# Patient Record
Sex: Male | Born: 1995 | Race: White | Hispanic: Yes | Marital: Single
Health system: Southern US, Community
[De-identification: ages and names within clinical notes are randomized; demographics above are authoritative.]

## PROBLEM LIST (undated history)

## (undated) DIAGNOSIS — Z789 Other specified health status: Secondary | ICD-10-CM

## (undated) DIAGNOSIS — A048 Other specified bacterial intestinal infections: Secondary | ICD-10-CM

## (undated) HISTORY — DX: Other specified health status: Z78.9

## (undated) HISTORY — DX: Other specified bacterial intestinal infections: A04.8

---
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
## 2017-02-01 ENCOUNTER — Telehealth: Payer: Self-pay

## 2017-02-01 NOTE — Telephone Encounter (Signed)
Pre visit call attempted. Left message for return call. 

## 2017-02-02 ENCOUNTER — Ambulatory Visit: Payer: No Typology Code available for payment source | Admitting: Family Medicine

## 2017-02-05 ENCOUNTER — Encounter: Payer: Self-pay | Admitting: Family Medicine

## 2017-02-05 ENCOUNTER — Ambulatory Visit (INDEPENDENT_AMBULATORY_CARE_PROVIDER_SITE_OTHER): Payer: PRIVATE HEALTH INSURANCE | Admitting: Family Medicine

## 2017-02-05 VITALS — BP 112/70 | HR 55 | Temp 98.6°F | Ht 64.0 in | Wt 124.1 lb

## 2017-02-05 DIAGNOSIS — Z114 Encounter for screening for human immunodeficiency virus [HIV]: Secondary | ICD-10-CM

## 2017-02-05 DIAGNOSIS — N481 Balanitis: Secondary | ICD-10-CM | POA: Diagnosis not present

## 2017-02-05 DIAGNOSIS — Z0001 Encounter for general adult medical examination with abnormal findings: Secondary | ICD-10-CM

## 2017-02-05 DIAGNOSIS — Z0000 Encounter for general adult medical examination without abnormal findings: Secondary | ICD-10-CM

## 2017-02-05 MED ORDER — CLOTRIMAZOLE 1 % EX CREA: 1.0000 "application " | TOPICAL_CREAM | Freq: Two times a day (BID) | CUTANEOUS | 0 refills | Status: AC

## 2017-02-05 NOTE — Patient Instructions (Signed)
Apply cream twice daily for 4 weeks. If no improvement, return to clinic.  Fast for 9-12 hours prior to your lab appointment.  Let us know if you need anything.

## 2017-02-05 NOTE — Progress Notes (Signed)
Pre visit review using our clinic review tool, if applicable. No additional management support is needed unless otherwise documented below in the visit note. 

## 2017-02-05 NOTE — Progress Notes (Signed)
Chief Complaint  Patient presents with  . Establish Care    Well Male Kenneth Vega is here for a complete physical.   His last physical was >1 year ago.  Current diet: in general, a "healthy" diet   Current exercise: Plays basketball, soccer, lift weights Weight trend: stable Does pt snore? No. Daytime fatigue? No. Seat belt? Yes.    Has itchy area around head of penis. Notices it most when he works out. He is sexually active and has engaged in unprotected intercourse recently. No fevers, abd pain, testicular pain, pain with urination, bleeding, discharge, or partner complaints.   Health maintenance Tetanus- Yes 04/2016 HIV- Yes- has had unprotected intercourse since then and would like to be checked again  Past Medical History:  Diagnosis Date  . No known health problems     Past Surgical History:  Procedure Laterality Date  . CHOLECYSTECTOMY  07/31/2015   Medications  Takes no medications routinely.   Allergies No Known Allergies   Family History Family History  Problem Relation Age of Onset  . Cancer Neg Hx     Review of Systems: Constitutional:  no unexpected change in weight, no fevers or chills Eye:  no recent significant change in vision Ear/Nose/Mouth/Throat:  Ears:  no tinnitus or hearing loss Nose/Mouth/Throat:  no complaints of nasal congestion or bleeding, no sore throat and oral sores Cardiovascular:  no chest pain, no palpitations Respiratory:  no cough and no shortness of breath Gastrointestinal:  no abdominal pain, no change in bowel habits, no nausea, vomiting, diarrhea, or constipation and no black or bloody stool GU:  Male: As noted above, otherwise negative for dysuria, frequency, and incontinence and negative for prostate symptoms Musculoskeletal/Extremities:  no pain, redness, or swelling of the joints Integumentary (Skin/Breast):  no abnormal skin lesions reported Neurologic:  no headaches, no numbness, tingling Endocrine: No weight  changes, masses in the neck, heat/cold intolerance, bowel or skin changes, or cardiovascular system symptoms Hematologic/Lymphatic:  no abnormal bleeding, no HIV risk factors, no night sweats, no swollen nodes, no weight loss  Exam BP 112/70 (BP Location: Left Arm, Patient Position: Sitting, Cuff Size: Normal)   Pulse (!) 55   Temp 98.6 F (37 C) (Oral)   Ht 5\' 4"  (1.626 m)   Wt 124 lb 2 oz (56.3 kg)   SpO2 96%   BMI 21.31 kg/m  General:  well developed, well nourished, in no apparent distress Skin:  no significant moles, warts, or growths Head:  no masses, lesions, or tenderness Eyes:  pupils equal and round, sclera anicteric without injection Ears:  canals without lesions, TMs shiny without retraction, no obvious effusion, no erythema Nose:  nares patent, septum midline, mucosa normal Throat/Pharynx:  lips and gingiva without lesion; tongue and uvula midline; non-inflamed pharynx; no exudates or postnasal drainage Neck: neck supple without adenopathy, thyromegaly, or masses Lungs:  clear to auscultation, breath sounds equal bilaterally, no respiratory distress Cardio:  regular rate and rhythm without murmurs, heart sounds without clicks or rubs Abdomen:  abdomen soft, nontender; bowel sounds normal; no masses or organomegaly Genital (male): Uncircumcised penis, mild whitish discharge surrounding base of glans and distal shaft; otherwise no lesions or discharge; testes present bilaterally without masses or tenderness Rectal: Deferred Musculoskeletal:  symmetrical muscle groups noted without atrophy or deformity Extremities:  no clubbing, cyanosis, or edema, no deformities, no skin discoloration Neuro:  gait normal; deep tendon reflexes normal and symmetric Psych: well oriented with normal range of affect and appropriate judgment/insight  Assessment and Plan  Well adult exam - Plan: CBC, Comprehensive metabolic panel, Lipid panel  Encounter for screening for HIV - Plan: HIV  antibody  Balanitis - Plan: clotrimazole (LOTRIMIN) 1 % cream   Well 21 y.o. male. Counseled on diet and exercise. Other orders as above. If cream not helpful after 4 weeks, will have him return to be re-evaluated. Will trial 1% HC cream at that point and refer to derm if that is not helpful. Follow up for fasting labs, otherwise in 1 year pending the above workup. The patient voiced understanding and agreement to the plan.  Jilda Rocheicholas Paul RosedaleWendling, DO 02/05/17 4:35 PM

## 2017-02-12 ENCOUNTER — Other Ambulatory Visit (INDEPENDENT_AMBULATORY_CARE_PROVIDER_SITE_OTHER): Payer: PRIVATE HEALTH INSURANCE

## 2017-02-12 DIAGNOSIS — Z0000 Encounter for general adult medical examination without abnormal findings: Secondary | ICD-10-CM

## 2017-02-12 DIAGNOSIS — Z114 Encounter for screening for human immunodeficiency virus [HIV]: Secondary | ICD-10-CM

## 2017-02-12 LAB — COMPREHENSIVE METABOLIC PANEL
ALT: 20 U/L (ref 0–53)
AST: 20 U/L (ref 0–37)
Albumin: 4.6 g/dL (ref 3.5–5.2)
Alkaline Phosphatase: 75 U/L (ref 39–117)
BUN: 11 mg/dL (ref 6–23)
CHLORIDE: 104 meq/L (ref 96–112)
CO2: 32 mEq/L (ref 19–32)
CREATININE: 0.92 mg/dL (ref 0.40–1.50)
Calcium: 9.7 mg/dL (ref 8.4–10.5)
GFR: 111.42 mL/min (ref >60.00)
GLUCOSE: 97 mg/dL (ref 70–99)
POTASSIUM: 3.4 meq/L — AB (ref 3.5–5.1)
SODIUM: 141 meq/L (ref 135–145)
Total Bilirubin: 0.7 mg/dL (ref 0.2–1.2)
Total Protein: 7.2 g/dL (ref 6.0–8.3)

## 2017-02-12 LAB — LIPID PANEL
CHOL/HDL RATIO: 4
Cholesterol: 162 mg/dL (ref 0–200)
HDL: 44.3 mg/dL (ref >39.00)
LDL CALC: 97 mg/dL (ref 0–99)
NONHDL: 118.06
Triglycerides: 103 mg/dL (ref 0.0–149.0)
VLDL: 20.6 mg/dL (ref 0.0–40.0)

## 2017-02-12 LAB — CBC
HEMATOCRIT: 43.4 % (ref 39.0–52.0)
Hemoglobin: 14.8 g/dL (ref 13.0–17.0)
MCHC: 34.1 g/dL (ref 30.0–36.0)
MCV: 87.8 fl (ref 78.0–100.0)
Platelets: 189 10*3/uL (ref 150.0–400.0)
RBC: 4.94 Mil/uL (ref 4.22–5.81)
RDW: 13.2 % (ref 11.5–14.6)
WBC: 5.1 10*3/uL (ref 4.5–10.5)

## 2017-02-12 LAB — HIV ANTIBODY (ROUTINE TESTING W REFLEX): HIV: NONREACTIVE

## 2017-02-13 ENCOUNTER — Other Ambulatory Visit: Payer: Self-pay | Admitting: Family Medicine

## 2017-02-13 MED ORDER — POTASSIUM CHLORIDE ER 10 MEQ PO TBCR: 10.0000 meq | EXTENDED_RELEASE_TABLET | Freq: Two times a day (BID) | ORAL | 0 refills | Status: DC

## 2017-02-13 NOTE — Progress Notes (Signed)
14 tabs of Klor con sent in, take twice daily for 7 days, recheck K the following day as detailed in results encounter.

## 2017-02-22 ENCOUNTER — Telehealth: Payer: Self-pay | Admitting: *Deleted

## 2017-02-22 DIAGNOSIS — E876 Hypokalemia: Secondary | ICD-10-CM

## 2017-02-22 NOTE — Telephone Encounter (Signed)
-----   Message from Sharlene DoryNicholas Paul Wendling, DO sent at 02/13/2017  2:01 PM EDT ----- Mild hypokalemia. Other labs WNL.  AB- please let pt know his potassium is slightly low, I called in supplement, let's recheck in 8 days after being on supplement for 7 days. Please schedule pt and order BMP for dx hypokalemia. TY.

## 2017-02-22 NOTE — Telephone Encounter (Signed)
Spoke with the pt and informed him of recent lab results and note.  Pt verbalized understanding and agreed.  Pt stated that he will go by the pharmacy and pick up the new prescription.  Pt was scheduled a lab appt for (Mon-03/05/17 @ 3:15pm).  Future lab ordered and sent.//AB/CMA

## 2017-03-05 ENCOUNTER — Other Ambulatory Visit: Payer: PRIVATE HEALTH INSURANCE

## 2017-03-08 ENCOUNTER — Other Ambulatory Visit: Payer: PRIVATE HEALTH INSURANCE

## 2018-02-08 ENCOUNTER — Encounter: Payer: PRIVATE HEALTH INSURANCE | Admitting: Family Medicine

## 2018-02-22 ENCOUNTER — Encounter: Payer: Self-pay | Admitting: Family Medicine

## 2019-08-27 ENCOUNTER — Ambulatory Visit: Payer: PRIVATE HEALTH INSURANCE | Attending: Internal Medicine

## 2019-08-27 DIAGNOSIS — Z20822 Contact with and (suspected) exposure to covid-19: Secondary | ICD-10-CM

## 2019-08-28 LAB — NOVEL CORONAVIRUS, NAA: SARS-CoV-2, NAA: NOT DETECTED

## 2022-04-06 ENCOUNTER — Encounter: Payer: Self-pay | Admitting: Family Medicine

## 2022-04-06 ENCOUNTER — Ambulatory Visit (INDEPENDENT_AMBULATORY_CARE_PROVIDER_SITE_OTHER): Payer: 59 | Admitting: Family Medicine

## 2022-04-06 VITALS — BP 128/84 | HR 77 | Temp 97.7°F | Ht 63.0 in | Wt 148.0 lb

## 2022-04-06 DIAGNOSIS — Z1159 Encounter for screening for other viral diseases: Secondary | ICD-10-CM

## 2022-04-06 DIAGNOSIS — Z8639 Personal history of other endocrine, nutritional and metabolic disease: Secondary | ICD-10-CM

## 2022-04-06 DIAGNOSIS — K219 Gastro-esophageal reflux disease without esophagitis: Secondary | ICD-10-CM

## 2022-04-06 DIAGNOSIS — Z0000 Encounter for general adult medical examination without abnormal findings: Secondary | ICD-10-CM

## 2022-04-06 DIAGNOSIS — F325 Major depressive disorder, single episode, in full remission: Secondary | ICD-10-CM

## 2022-04-06 DIAGNOSIS — J029 Acute pharyngitis, unspecified: Secondary | ICD-10-CM | POA: Insufficient documentation

## 2022-04-06 DIAGNOSIS — Z13220 Encounter for screening for lipoid disorders: Secondary | ICD-10-CM | POA: Diagnosis not present

## 2022-04-06 DIAGNOSIS — Z1329 Encounter for screening for other suspected endocrine disorder: Secondary | ICD-10-CM

## 2022-04-06 DIAGNOSIS — R768 Other specified abnormal immunological findings in serum: Secondary | ICD-10-CM

## 2022-04-06 MED ORDER — OMEPRAZOLE 20 MG PO CPDR: 20.0000 mg | DELAYED_RELEASE_CAPSULE | Freq: Every day | ORAL | 3 refills | Status: AC

## 2022-04-06 NOTE — Assessment & Plan Note (Signed)
Asymptomatic today Recheck electrolytes

## 2022-04-06 NOTE — Assessment & Plan Note (Signed)
No SI or HI Continue to monitor

## 2022-04-06 NOTE — Progress Notes (Signed)
Chief Complaint:  Kenneth Vega is a 26 y.o. male who presents today for his annual comprehensive physical exam.    Assessment/Plan:   Problem List Items Addressed This Visit       Digestive   Gastroesophageal reflux disease    Mild Trial omeprazole Follow-up 2 months      Relevant Medications   omeprazole (PRILOSEC) 20 MG capsule   Other Relevant Orders   CBC w/Diff   Comp Met (CMET)   H. pylori antibody, IgG     Other   Sore throat    Left viral Has resolved      History of hypokalemia    Asymptomatic today Recheck electrolytes      Relevant Orders   Comp Met (CMET)   Major depressive disorder with single episode, in full remission ([place])    No SI or HI Continue to monitor      Other Visit Diagnoses     Annual physical exam    -  Primary   Relevant Orders   CBC w/Diff   Comp Met (CMET)   Lipid Profile   Hemoglobin A1C   Hepatitis C Antibody   Screening, lipid       Relevant Orders   Lipid Profile   Screening for endocrine disorder       Relevant Orders   Hemoglobin A1C   Encounter for hepatitis C screening test for low risk patient       Relevant Orders   Hepatitis C Antibody        Patient Counseling(The following topics were reviewed and/or handout was given):  -Nutrition: Stressed importance of moderation in sodium/caffeine intake, saturated fat and cholesterol, caloric balance, sufficient intake of fresh fruits, vegetables, and fiber.  -Stressed the importance of regular exercise.   -Substance Abuse: Discussed cessation/primary prevention of tobacco, alcohol, or other drug use; driving or other dangerous activities under the influence; availability of treatment for abuse.   -Injury prevention: Discussed safety belts, safety helmets, smoke detector, smoking near bedding or upholstery.   -Sexuality: Discussed sexually transmitted diseases, partner selection, use of condoms, avoidance of unintended pregnancy and contraceptive alternatives.    -Dental health: Discussed importance of regular tooth brushing, flossing, and dental visits.  -Health maintenance and immunizations reviewed. Please refer to Health maintenance section.  Return to care in 1 year for next preventative visit.     Subjective:  HPI:  He presents with chief complaint of Establish Care (Patient had a sore throat x 1 week ago and seen at ED and tested negative for mono, covid and strep. ) .   Patient reports that he had a sore throat.  He was seen at urgent care where they did a COVID/flu/RSV/mono/strep.  All testing was negative.  Patient was treated with viscous lidocaine and OTC analgesics.  Reports that this has resolved.  Patient has a h/o cholecystectomy secondary to cholelithiasis.  Reviewed pathology from outside hospital showed no malignancy.    Patient does report that he gets some abdominal discomfort and bloating.  This is not like the right upper quadrant pain he had when he had his gallbladder removed.  He says that this occurs after eating a large meal at night, particularly heavy greasy foods and then lying down.  He feels that he is also bloated with a lot of gas.  Denies any dysphagia, vomiting, nausea, diarrhea, constipation, blood in stool.  Patient reports that he had a history of hypokalemia.  He has been on potassium supplements in  the past.  He is not taking it now.  Denies any chest pain, shortness of breath, palpitation.  Patient has history of depression.  Had been on fluoxetine in the past.  This was around 2016.  It occurred after a break-up.  He has not been on medication long time.  Has no SI or HI.   Lifestyle Diet: Does eat some greasy foods Exercise: Exercises 4 times a week     04/06/2022    3:28 PM  Depression screen PHQ 2/9  Decreased Interest 0  Down, Depressed, Hopeless 0  PHQ - 2 Score 0  Altered sleeping 0  Tired, decreased energy 0  Change in appetite 0  Feeling bad or failure about yourself  0  Trouble  concentrating 0  Moving slowly or fidgety/restless 0  Suicidal thoughts 0  PHQ-9 Score 0  Difficult doing work/chores Not difficult at all    Health Maintenance Due  Topic Date Due   Hepatitis C Screening  Never done   INFLUENZA VACCINE  03/07/2022        ROS: As per HPI, otherwise all systems reviewed and are negative  PMH:  The following were reviewed and entered/updated in epic: Past Medical History:  Diagnosis Date   No known health problems    Patient Active Problem List   Diagnosis Date Noted   Sore throat 04/06/2022   History of hypokalemia 04/06/2022   Gastroesophageal reflux disease 04/06/2022   Major depressive disorder with single episode, in full remission ([place]) 04/06/2022   Past Surgical History:  Procedure Laterality Date   CHOLECYSTECTOMY  07/31/2015    Family History  Problem Relation Age of Onset   Cancer Neg Hx     Medications- reviewed and updated Current Outpatient Medications  Medication Sig Dispense Refill   omeprazole (PRILOSEC) 20 MG capsule Take 1 capsule (20 mg total) by mouth daily. 30 capsule 3   potassium chloride (KLOR-CON 10) 10 MEQ tablet Take 1 tablet (10 mEq total) by mouth 2 (two) times daily. 14 tablet 0   No current facility-administered medications for this visit.    Allergies-reviewed and updated No Known Allergies  Social History   Socioeconomic History   Marital status: Single    Spouse name: Not on file   Number of children: Not on file   Years of education: Not on file   Highest education level: Not on file  Occupational History   Not on file  Tobacco Use   Smoking status: Never    Passive exposure: Never   Smokeless tobacco: Never  Vaping Use   Vaping Use: Never used  Substance and Sexual Activity   Alcohol use: No   Drug use: No   Sexual activity: Never  Other Topics Concern   Not on file  Social History Narrative   Not on file   Social Determinants of Health   Financial Resource Strain: Not  on file  Food Insecurity: Not on file  Transportation Needs: Not on file  Physical Activity: Not on file  Stress: Not on file  Social Connections: Not on file        Objective:  Physical Exam: BP 128/84 (BP Location: Left Arm, Patient Position: Sitting, Cuff Size: Large)   Pulse 77   Temp 97.7 F (36.5 C) (Temporal)   Ht 5' 3"  (1.6 m)   Wt 148 lb (67.1 kg)   SpO2 98%   BMI 26.22 kg/m   Body mass index is 26.22 kg/m. Wt Readings from Last 3  Encounters:  04/06/22 148 lb (67.1 kg)  02/05/17 124 lb 2 oz (56.3 kg)  11/25/15 126 lb (57.2 kg) (10 %, Z= -1.28)*   * Growth percentiles are based on CDC (Boys, 2-20 Years) data.    Gen: NAD, resting comfortably, muscular build CV: RRR with no murmurs appreciated Pulm: NWOB, CTAB with no crackles, wheezes, or rhonchi GI: Normal bowel sounds present. Soft, Nontender, Nondistended. MSK: no edema, cyanosis, or clubbing noted Skin: warm, dry Neuro: grossly normal, moves all extremities Psych: Normal affect and thought content      At today's visit, we discussed treatment options, associated risk and benefits, and engage in counseling as needed.  Additionally the following were reviewed: Past medical records, past medical and surgical history, family and social background, as well as relevant laboratory results, imaging findings, and specialty notes, where applicable.  This message was generated using dictation software, and as a result, it may contain unintentional typos or errors.  Nevertheless, extensive effort was made to accurately convey at the pertinent aspects of the patient visit.    There may have been are other unrelated non-urgent complaints, but due to the busy schedule and the amount of time already spent with him, time does not permit to address these issues at today's visit. Another appointment may have or has been requested to review these additional issues.   Marny Lowenstein, MD, MS

## 2022-04-06 NOTE — Assessment & Plan Note (Signed)
Mild Trial omeprazole Follow-up 2 months

## 2022-04-06 NOTE — Assessment & Plan Note (Signed)
Left viral Has resolved

## 2022-04-07 LAB — COMPREHENSIVE METABOLIC PANEL
ALT: 33 U/L (ref 0–53)
AST: 27 U/L (ref 0–37)
Albumin: 4.7 g/dL (ref 3.5–5.2)
Alkaline Phosphatase: 75 U/L (ref 39–117)
BUN: 17 mg/dL (ref 6–23)
CO2: 26 mEq/L (ref 19–32)
Calcium: 9.4 mg/dL (ref 8.4–10.5)
Chloride: 106 mEq/L (ref 96–112)
Creatinine, Ser: 0.98 mg/dL (ref 0.40–1.50)
GFR: 107.4 mL/min (ref >60.00)
Glucose, Bld: 69 mg/dL — ABNORMAL LOW (ref 70–99)
Potassium: 3.7 mEq/L (ref 3.5–5.1)
Sodium: 142 mEq/L (ref 135–145)
Total Bilirubin: 0.5 mg/dL (ref 0.2–1.2)
Total Protein: 7.7 g/dL (ref 6.0–8.3)

## 2022-04-07 LAB — CBC WITH DIFFERENTIAL/PLATELET
Basophils Absolute: 0.1 10*3/uL (ref 0.0–0.1)
Basophils Relative: 0.9 % (ref 0.0–3.0)
Eosinophils Absolute: 0.1 10*3/uL (ref 0.0–0.7)
Eosinophils Relative: 2 % (ref 0.0–5.0)
HCT: 41 % (ref 39.0–52.0)
Hemoglobin: 14.1 g/dL (ref 13.0–17.0)
Lymphocytes Relative: 32.1 % (ref 12.0–46.0)
Lymphs Abs: 2.1 10*3/uL (ref 0.7–4.0)
MCHC: 34.5 g/dL (ref 30.0–36.0)
MCV: 86.5 fl (ref 78.0–100.0)
Monocytes Absolute: 0.3 10*3/uL (ref 0.1–1.0)
Monocytes Relative: 4.2 % (ref 3.0–12.0)
Neutro Abs: 4 10*3/uL (ref 1.4–7.7)
Neutrophils Relative %: 60.8 % (ref 43.0–77.0)
Platelets: 228 10*3/uL (ref 150.0–400.0)
RBC: 4.74 Mil/uL (ref 4.22–5.81)
RDW: 12.8 % (ref 11.5–15.5)
WBC: 6.6 10*3/uL (ref 4.0–10.5)

## 2022-04-07 LAB — HEPATITIS C ANTIBODY: Hepatitis C Ab: NONREACTIVE

## 2022-04-07 LAB — LIPID PANEL
Cholesterol: 221 mg/dL — ABNORMAL HIGH (ref 0–200)
HDL: 45.2 mg/dL (ref >39.00)
NonHDL: 176.26
Total CHOL/HDL Ratio: 5
Triglycerides: 215 mg/dL — ABNORMAL HIGH (ref 0.0–149.0)
VLDL: 43 mg/dL — ABNORMAL HIGH (ref 0.0–40.0)

## 2022-04-07 LAB — H. PYLORI ANTIBODY, IGG: H Pylori IgG: POSITIVE — AB

## 2022-04-07 LAB — LDL CHOLESTEROL, DIRECT: Direct LDL: 158 mg/dL

## 2022-04-07 LAB — HEMOGLOBIN A1C: Hgb A1c MFr Bld: 5.7 % (ref 4.6–6.5)

## 2022-04-07 NOTE — Addendum Note (Signed)
Addended by: Fanny Bien B on: 04/07/2022 11:05 AM   Modules accepted: Orders

## 2022-04-12 ENCOUNTER — Telehealth: Payer: Self-pay | Admitting: Family Medicine

## 2022-04-12 NOTE — Telephone Encounter (Signed)
Pt is wanting a call back concerning his most recent lab results. Please advise pt @ [phone]

## 2022-04-13 ENCOUNTER — Telehealth: Payer: Self-pay | Admitting: Family Medicine

## 2022-04-13 NOTE — Telephone Encounter (Signed)
Contacted patient and provided results. Patient verbalized understanding.   

## 2022-04-13 NOTE — Telephone Encounter (Signed)
Left patient a detailed voice message to return call to office.   

## 2022-04-13 NOTE — Telephone Encounter (Signed)
Pt returned your call he missed about his test results. Pt stated you can call hem back anytime after 3pm when he gets off of work

## 2022-04-14 ENCOUNTER — Other Ambulatory Visit: Payer: 59

## 2022-04-14 DIAGNOSIS — R768 Other specified abnormal immunological findings in serum: Secondary | ICD-10-CM

## 2022-04-18 LAB — H. PYLORI BREATH TEST: H. pylori Breath Test: DETECTED — AB

## 2022-04-27 ENCOUNTER — Encounter: Payer: Self-pay | Admitting: Gastroenterology

## 2022-05-10 ENCOUNTER — Encounter: Payer: Self-pay | Admitting: Gastroenterology

## 2022-05-10 ENCOUNTER — Ambulatory Visit (INDEPENDENT_AMBULATORY_CARE_PROVIDER_SITE_OTHER): Payer: 59 | Admitting: Gastroenterology

## 2022-05-10 VITALS — BP 100/80 | HR 72 | Ht 63.0 in | Wt 147.0 lb

## 2022-05-10 DIAGNOSIS — K219 Gastro-esophageal reflux disease without esophagitis: Secondary | ICD-10-CM | POA: Diagnosis not present

## 2022-05-10 DIAGNOSIS — A048 Other specified bacterial intestinal infections: Secondary | ICD-10-CM | POA: Diagnosis not present

## 2022-05-10 MED ORDER — OMEPRAZOLE 20 MG PO CPDR: 20.0000 mg | DELAYED_RELEASE_CAPSULE | Freq: Two times a day (BID) | ORAL | 0 refills | Status: AC

## 2022-05-10 MED ORDER — BISMUTH SUBSALICYLATE 262 MG PO CHEW: 524.0000 mg | CHEWABLE_TABLET | Freq: Four times a day (QID) | ORAL | 0 refills | Status: AC

## 2022-05-10 MED ORDER — METRONIDAZOLE 250 MG PO TABS: 250.0000 mg | ORAL_TABLET | Freq: Four times a day (QID) | ORAL | 0 refills | Status: AC

## 2022-05-10 MED ORDER — DOXYCYCLINE HYCLATE 100 MG PO CAPS: 100.0000 mg | ORAL_CAPSULE | Freq: Two times a day (BID) | ORAL | 0 refills | Status: AC

## 2022-05-10 NOTE — Progress Notes (Signed)
HPI : 26 year old male with history of cholecystectomy referred to [place] by Dr. Fanny Bien for treatment of H. Pylori infection.  He was found to have a positive H. Pylori serology and then subsequently a positive breath test after complaining of abdominal bloating and dyspepsia.  This has been going on for many months.  He had been having some mild heartburn and acid regurgitation, but these symptoms have improved since he made some dietary changes which include eating less heavy/greasy foods and making healthier choices in general.  His symptoms are more noticeable in the evenings and morning upon awakening, especially if he overeats at dinner. He occasional has nausea, no vomiting.  No significant abdominal pain, just discomfort/bloating.  No dysphagia. He has regular bowel movements and denies problems with constipation, diarrhea or hematochezia. He underwent a cholecystectomy in 2016 in [facility] because he was having recurrent attacks of RUQ pain.  This pain has not returned since his surgery. No family history of GI malignancy He denies taking antibiotics previously as far as he knows.  Past Medical History:  Diagnosis Date   No known health problems      Past Surgical History:  Procedure Laterality Date   CHOLECYSTECTOMY  07/31/2015   Family History  Problem Relation Age of Onset   Cancer Neg Hx    Social History   Tobacco Use   Smoking status: Never    Passive exposure: Never   Smokeless tobacco: Never  Vaping Use   Vaping Use: Never used  Substance Use Topics   Alcohol use: No   Drug use: No   Current Outpatient Medications  Medication Sig Dispense Refill   omeprazole (PRILOSEC) 20 MG capsule Take 1 capsule (20 mg total) by mouth daily. 30 capsule 3   potassium chloride (KLOR-CON 10) 10 MEQ tablet Take 1 tablet (10 mEq total) by mouth 2 (two) times daily. 14 tablet 0   No current facility-administered medications for this visit.   No Known Allergies   Review  of Systems: All systems reviewed and negative except where noted in HPI.    No results found.  Physical Exam: Ht 5\' 3"  (1.6 m) Comment: height meaured without shoes  Wt 147 lb (66.7 kg)   BMI 26.04 kg/m  Constitutional: Pleasant,well-developed, Hispanic male in no acute distress. HEENT: Normocephalic and atraumatic. Conjunctivae are normal. No scleral icterus. Cardiovascular: Normal rate, regular rhythm.  Pulmonary/chest: Effort normal and breath sounds normal. No wheezing, rales or rhonchi. Abdominal: Soft, nondistended, nontender. Bowel sounds active throughout. There are no masses palpable. No hepatomegaly. Extremities: no edema Neurological: Alert and oriented to person place and time. Skin: Skin is warm and dry. No rashes noted. Psychiatric: Normal mood and affect. Behavior is normal.  CBC    Component Value Date/Time   WBC 6.6 04/06/2022 1526   RBC 4.74 04/06/2022 1526   HGB 14.1 04/06/2022 1526   HCT 41.0 04/06/2022 1526   PLT 228.0 04/06/2022 1526   MCV 86.5 04/06/2022 1526   MCH 29.1 02/04/2011 0755   MCHC 34.5 04/06/2022 1526   RDW 12.8 04/06/2022 1526   LYMPHSABS 2.1 04/06/2022 1526   MONOABS 0.3 04/06/2022 1526   EOSABS 0.1 04/06/2022 1526   BASOSABS 0.1 04/06/2022 1526    CMP     Component Value Date/Time   NA 142 04/06/2022 1526   K 3.7 04/06/2022 1526   CL 106 04/06/2022 1526   CO2 26 04/06/2022 1526   GLUCOSE 69 (L) 04/06/2022 1526  BUN 17 04/06/2022 1526   CREATININE 0.98 04/06/2022 1526   CALCIUM 9.4 04/06/2022 1526   PROT 7.7 04/06/2022 1526   ALBUMIN 4.7 04/06/2022 1526   AST 27 04/06/2022 1526   ALT 33 04/06/2022 1526   ALKPHOS 75 04/06/2022 1526   BILITOT 0.5 04/06/2022 1526   GFRNONAA NOT CALCULATED 02/04/2011 0755   GFRAA NOT CALCULATED 02/04/2011 0755   Component Ref Range & Units 3 wk ago  H. pylori Breath Test NOT DETECTED DETECTED Abnormal    Component Ref Range & Units 1 mo ago  H Pylori IgG Negative Positive Abnormal       ASSESSMENT AND PLAN: 26 year old male with bloating and dyspepsia, found to have positive H. Pylori breath test.  No previous antibiotic exposure that is aware of.  Will treat with quadruple therapy.  We discussed the importance of medication adherence in treating H. Pylori due to the high rates of antibiotic resistance.  We discussed the potential risks of chronic H. Pylori infection to include increased risk of PUD and gastric cancer, and the importance of confirming eradication after treatment.  We discussed the pathophysiology of GERD and the principles of GERD management to include lifestyle modifications  such as dietary discretion (avoidance of alcohol, tobacco, caffeinated and carbonated beverages, spicy/greasy foods, citrus, peppermint/chocolate), weight loss if applicable, head of bed elevation andconsuming last meal of day within 3 hours of bedtime; pharmacologic options to include PPIs, H2RAs and OTC antacids.  It seems like his GERD symptoms are very mild and have responded to some dietary adjustments.   H. Pylori infection - Treat with quadruple therapy 1) Omeprazole 20 mg 2 times a day x 14 d 2) Pepto Bismol 2 tabs (262 mg each) 4 times a day x 14 d 3) Metronidazole 250 mg 4 times a day x 14 d 4) doxycycline 100 mg 2 times a day x 14 d --  4 weeks after treatment completed, check H. Pylori stool antigen to confirm eradication (must be off acid suppression therapy for 2 weeks prior to specimen submission)  GERD -- Continue dietary changes/lifestyle changes -- PRN antacids  Kadan Millstein E. Candis Schatz, MD [facility]   CC:  Bonnita Hollow, MD

## 2022-05-10 NOTE — Patient Instructions (Addendum)
_______________________________________________________  If you are age 25 or older, your body mass index should be between 23-30. Your Body mass index is 26.04 kg/m. If this is out of the aforementioned range listed, please consider follow up with your Primary Care Provider.  If you are age 26 or younger, your body mass index should be between 19-25. Your Body mass index is 26.04 kg/m. If this is out of the aformentioned range listed, please consider follow up with your Primary Care Provider.   We have sent the following medications to your pharmacy for you to pick up at your convenience:  Omeprazole 20 mg twice daily for 14 days.  Pepto Bismol 262 mg two tablets four times daily for 14 days  Metronidazole 250 mg four times daily for 14 days.  Doxycycline 100 mg twice daily for 14 days.  Complete H pylori stool test in 2 weeks.  The [facility] providers would like to encourage you to use [facility] to communicate with providers for non-urgent requests or questions.  Due to long hold times on the telephone, sending your provider a message by [facility] may be a faster and more efficient way to get a response.  Please allow 48 business hours for a response.  Please remember that this is for non-urgent requests.   It was a pleasure to see you today!  Thank you for trusting me with your gastrointestinal care!    Scott E.Candis Schatz, MD

## 2022-06-15 ENCOUNTER — Ambulatory Visit: Payer: 59 | Admitting: Family Medicine

## 2022-06-15 ENCOUNTER — Telehealth: Payer: Self-pay | Admitting: Family Medicine

## 2022-06-15 NOTE — Telephone Encounter (Signed)
1st no show, fee waived, letter sent 

## 2022-06-15 NOTE — Telephone Encounter (Signed)
Pt was a no show for an OV with Dr. Janee Morn on 06/15/22, I sent a letter.
